# Patient Record
Sex: Female | Born: 1985 | Race: White | Hispanic: No | Marital: Married | Smoking: Never smoker
Health system: Southern US, Community
[De-identification: ages and names within clinical notes are randomized; demographics above are authoritative.]

## PROBLEM LIST (undated history)

## (undated) HISTORY — PX: WISDOM TOOTH EXTRACTION: SHX21

## (undated) HISTORY — PX: ESOPHAGOGASTRODUODENOSCOPY: SHX1529

---

## 2015-04-14 ENCOUNTER — Ambulatory Visit
Admission: EM | Admit: 2015-04-14 | Discharge: 2015-04-14 | Disposition: A | Discharge status: Home / Self Care | Payer: Managed Care, Other (non HMO) | Attending: Family Medicine | Admitting: Family Medicine

## 2015-04-14 ENCOUNTER — Ambulatory Visit (INDEPENDENT_AMBULATORY_CARE_PROVIDER_SITE_OTHER): Payer: Managed Care, Other (non HMO)

## 2015-04-14 DIAGNOSIS — M5489 Other dorsalgia: Secondary | ICD-10-CM

## 2015-04-14 DIAGNOSIS — R509 Fever, unspecified: Secondary | ICD-10-CM | POA: Diagnosis not present

## 2015-04-14 LAB — URINALYSIS COMPLETE WITH MICROSCOPIC (ARMC ONLY)
BILIRUBIN URINE: NEGATIVE
GLUCOSE, UA: NEGATIVE mg/dL
KETONES UR: NEGATIVE mg/dL
Nitrite: NEGATIVE
Protein, ur: NEGATIVE mg/dL
SPECIFIC GRAVITY, URINE: 1.01 (ref 1.005–1.030)
pH: 5.5 (ref 5.0–8.0)

## 2015-04-14 LAB — PREGNANCY, URINE: PREG TEST UR: NEGATIVE

## 2015-04-14 NOTE — ED Notes (Addendum)
Patient complains of right back pain that started on Monday and radiates from lower back to upper shoulder. She states that the pain is worse with breathing in and also worse with laying flat. Patient states that pain has been progressively getting worse.

## 2015-04-14 NOTE — ED Provider Notes (Addendum)
Patient presents today with right upper back pain radiating to the right lateral rib cage. Patient states that her symptoms started on Monday morning when she got up. She states that the symptoms are worse when she aches a deep breath or lies flat or coughs or laughs. She has noticed that the pain has started to get worse. She denies any upper respiratory symptoms but does have a low-grade fever here in the office today with a slightly elevated blood pressure. 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 She denies any abdominal pain, nausea, vomiting, diarrhea, severe headaches, urinary symptoms, night sweats, chest pain, shortness of breath. 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 Patient denies any calf swelling or pain. She denies doing anything out of the normal for her over the last several days. Patient does take OCPs and denies any personal or family hx of DVT/PE.   ROS: Negative except mentioned above.  Vitals as per Epic. GENERAL: NAD HEENT: no pharyngeal erythema, no exudate, no cervical LAD RESP: CTA B, no tenderness to palpation along chest or back CARD: RRR ABD: +BS, NT/ND, no flank tenderness, no rebound or guarding MSK: FROM of UEs, nv intact NEURO: CN II-XII grossly intact   A/P: Right upper back pain with shortness of breath and fever-recommend patient go to the ER for further evaluation and treatment. She will likely need labs including a D-dimer, CBC, CMP. A UA and urine pregnancy were done here in the office along with a chest x-ray. Chest x-ray was read as possible right sided atelectasis versus pneumonia. Patient however has not no upper respiratory symptoms including cough. Discussed with the patient that we should get a better idea of what her diagnosis/treatment is with labs and possible CT. Patient states that [facility] will [facility]. [facility] should be able to look at chest x-ray that was done here.  Jolene ProvostKirtida Kiowa Peifer, MD 04/14/15 [id number]  Jolene ProvostKirtida Casy Brunetto,  MD 04/14/15 [id number]

## 2016-07-23 IMAGING — CR DG CHEST 2V
2 series · 2 of 2 positions shown · non-contrast
Comparison: None.

CLINICAL DATA: Right rib pain without known injury.

EXAM:
CHEST  2 VIEW

[chest pa]
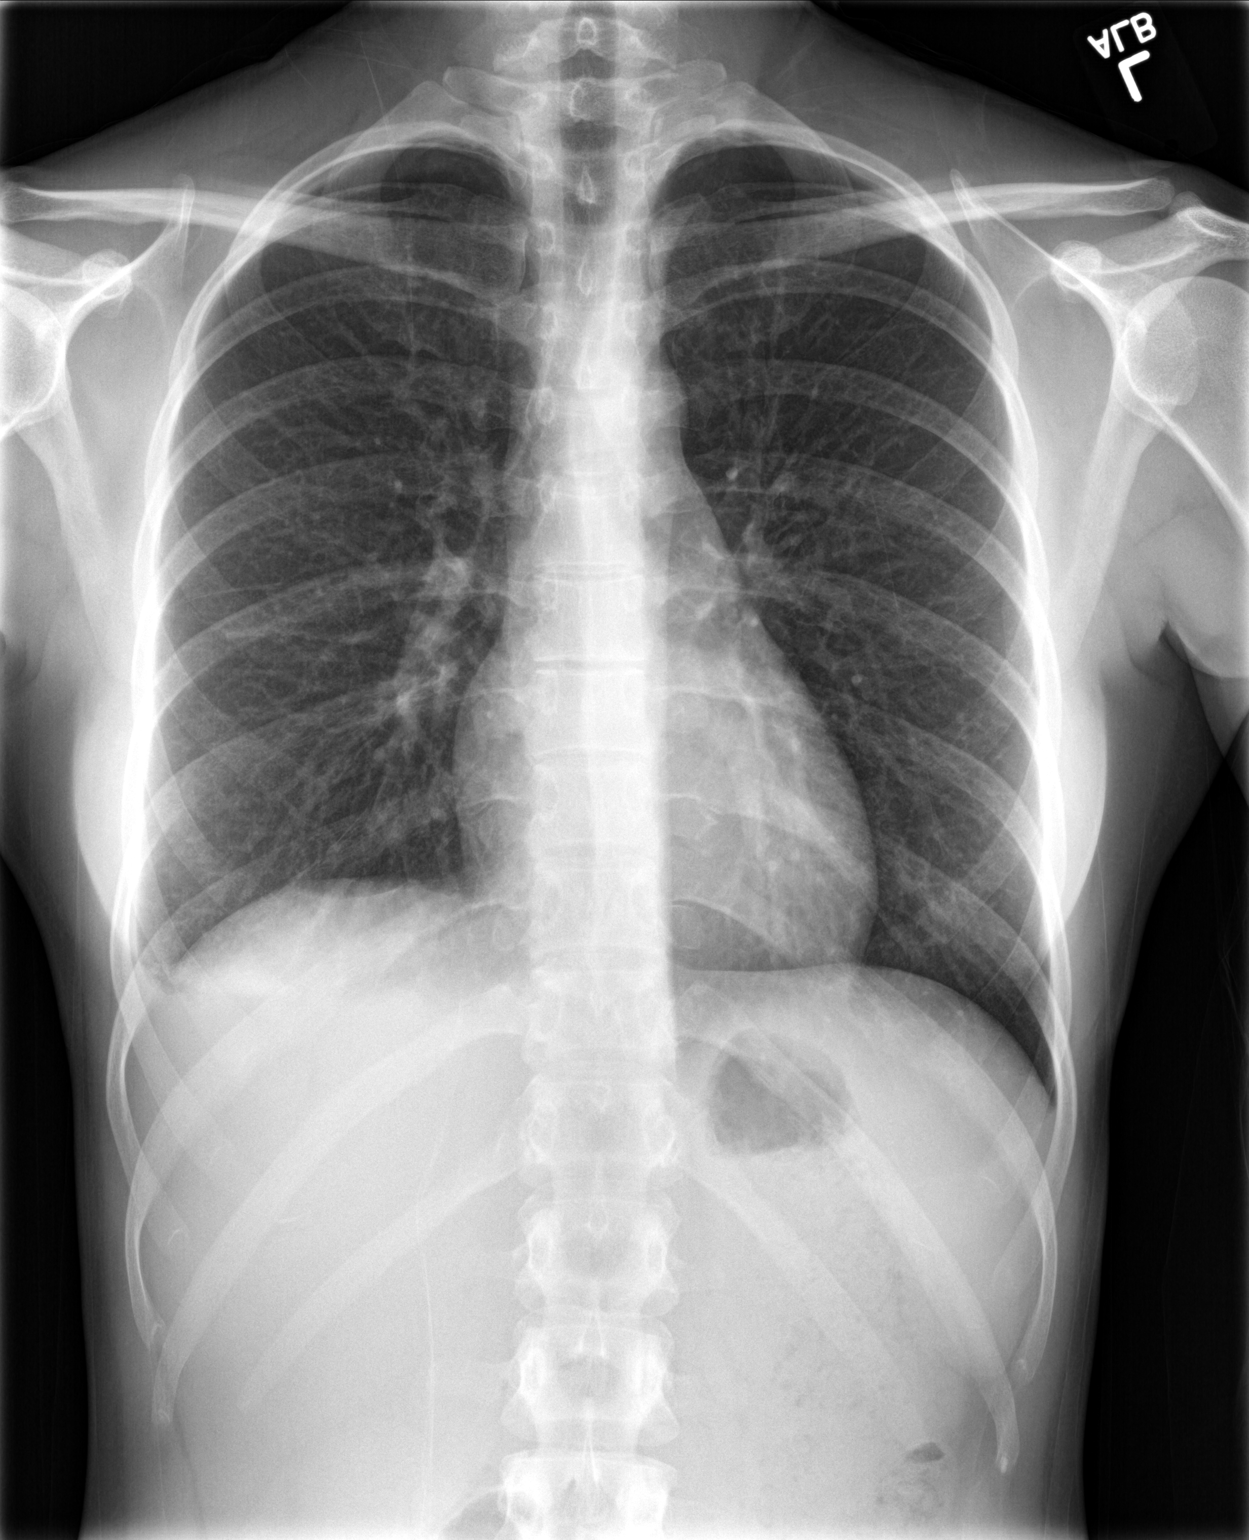

[chest lat]
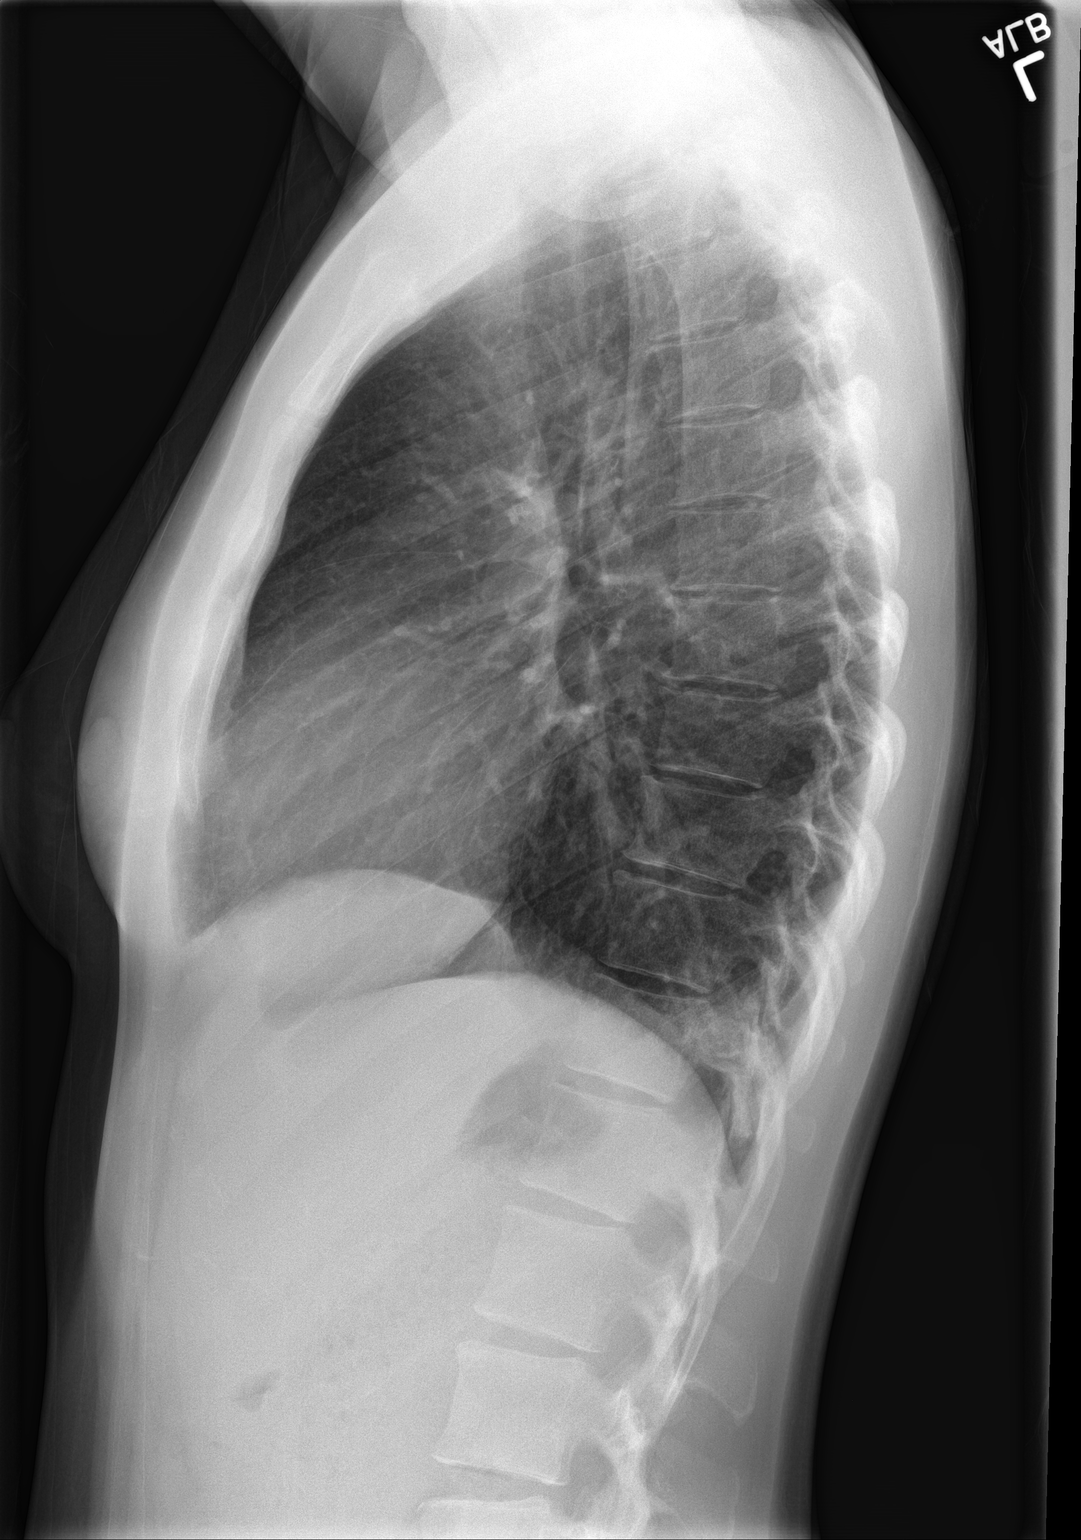

[2 of 2 positions shown; findings below may reference images not displayed]

FINDINGS: The heart size and mediastinal contours are within normal limits. No
pneumothorax is noted. Left lung is clear. Mild right basilar
opacity is noted concerning for subsegmental atelectasis or
pneumonia with minimal associated pleural effusion. The visualized
skeletal structures are unremarkable.
IMPRESSION: Mild right basilar opacity concerning for subsegmental atelectasis
or pneumonia with minimal associated pleural effusion.
